# Patient Record
Sex: Male | Born: 1980 | Hispanic: No | Marital: Married | Smoking: Current every day smoker
Health system: Southern US, Community
[De-identification: ages and names within clinical notes are randomized; demographics above are authoritative.]

---

## 2017-02-19 ENCOUNTER — Encounter: Payer: Self-pay | Admitting: Gynecology

## 2017-02-19 ENCOUNTER — Ambulatory Visit
Admission: EM | Admit: 2017-02-19 | Discharge: 2017-02-19 | Disposition: A | Discharge status: Home / Self Care | Payer: Self-pay | Attending: Family Medicine | Admitting: Family Medicine

## 2017-02-19 ENCOUNTER — Other Ambulatory Visit: Payer: Self-pay

## 2017-02-19 DIAGNOSIS — L03114 Cellulitis of left upper limb: Secondary | ICD-10-CM

## 2017-02-19 DIAGNOSIS — L0291 Cutaneous abscess, unspecified: Secondary | ICD-10-CM

## 2017-02-19 NOTE — Discharge Instructions (Signed)
Go straight to the ER. ° °Take care ° °Dr. Nowell Sites  °

## 2017-02-19 NOTE — ED Provider Notes (Signed)
[facility]    CSN: [id number] Arrival date & time: 02/19/17  1131  History   Chief Complaint Chief Complaint  Patient presents with  . Insect Bite  . Arm Swelling   HPI  36 year old male presents with left arm redness, swelling.  Patient states that he was working out of town this week.  He states that he did not stay in the best of hotels.  He is concerned that he had a bug bite.  He states that it started around his elbow.  It was a small area.  It is now tremendously worsened.  He has swelling and erythema which extends both proximally and distally.  Pain is moderate to severe. It it is essentially throughout his entire left arm.  His temperature is mildly elevated today.  He felt febrile at home.  No chills.  No medications or interventions tried.  He is concerned about his symptoms.  He has no other symptoms.  No other complaints at this time.  PMH - None.  Surgical Hx - None.  Home Medications    Prior to Admission medications   Not on File   Family History No reported family hx.  Social History Social History   Tobacco Use  . Smoking status: Current Every Day Smoker    Packs/day: 1.00    Types: Cigarettes  . Smokeless tobacco: Never Used  Substance Use Topics  . Alcohol use: Yes  . Drug use: No   Allergies   Patient has no known allergies.  Review of Systems Review of Systems  Skin:       Swelling, erythema, pain.  All other systems reviewed and are negative.  Physical Exam Triage Vital Signs ED Triage Vitals  Enc Vitals Group     BP 02/19/17 1150 121/63     Pulse Rate 02/19/17 1150 89     Resp 02/19/17 1150 16     Temp 02/19/17 1150 100.1 F (37.8 C)     Temp Source 02/19/17 1150 Oral     SpO2 02/19/17 1150 98 %     Weight 02/19/17 1150 170 lb (77.1 kg)     Height 02/19/17 1150 5\' 5"  (1.651 m)     Head Circumference --      Peak Flow --      Pain Score 02/19/17 1152 8     Pain Loc --      Pain Edu? --      Excl. in [place]? --     No data found.  Updated Vital Signs BP 121/63 (BP Location: Right Arm)   Pulse 89   Temp 100.1 F (37.8 C) (Oral)   Resp 16   Ht 5\' 5"  (1.651 m)   Wt 170 lb (77.1 kg)   SpO2 98%   BMI 28.29 kg/m   Visual Acuity Right Eye Distance:   Left Eye Distance:   Bilateral Distance:    Right Eye Near:   Left Eye Near:    Bilateral Near:     Physical Exam  Constitutional: He is oriented to person, place, and time. He appears well-developed. No distress.  Cardiovascular: Normal rate and regular rhythm.  No murmur heard. Pulmonary/Chest: Effort normal and breath sounds normal. He has no wheezes. He has no rales.  Musculoskeletal:  Left arm -unable to fully extend elbow secondary to swelling.  Neurological: He is alert and oriented to person, place, and time.  Skin:  Left arm -patient has a large area of induration around the left  medial elbow.  Erythema and swelling extends out both proximally and distally.  See the picture.  Psychiatric: He has a normal mood and affect. His behavior is normal.  Vitals reviewed.    UC Treatments / Results  Labs (all labs ordered are listed, but only abnormal results are displayed) Labs Reviewed - No data to display  EKG  EKG Interpretation None       Radiology No results found.  Procedures Procedures (including critical care time)  Medications Ordered in UC Medications - No data to display   Initial Impression / Assessment and Plan / UC Course  I have reviewed the triage vital signs and the nursing notes.  Pertinent labs & imaging results that were available during my care of the patient were reviewed by me and considered in my medical decision making (see chart for details).     36 year old male presents with diffuse cellulitis.  He also has an area concerning for abscess.  Given the extent of the infection, I am sending him directly to the ER.  Patient needs labs, IV antibiotics and likely surgery consult.  Final Clinical  Impressions(s) / UC Diagnoses   Final diagnoses:  Abscess  Cellulitis of left upper extremity    ED Discharge Orders    None     Controlled Substance Prescriptions [place] Controlled Substance Registry consulted? Not Applicable   Tommie SamsCook, Tirth Cothron G, DO 02/19/17 1233

## 2017-02-19 NOTE — ED Triage Notes (Signed)
Per patient x 3 days ago notice a lump on left arm. Pt. Question insect bite. Per patient now with left arm swelling and redness and warm to the touch.

## 2018-09-25 ENCOUNTER — Other Ambulatory Visit: Payer: Self-pay

## 2018-09-25 ENCOUNTER — Ambulatory Visit: Admission: EM | Admit: 2018-09-25 | Discharge: 2018-09-25 | Discharge status: Against Medical Advice | Payer: Self-pay

## 2018-09-25 ENCOUNTER — Encounter: Payer: Self-pay | Admitting: Emergency Medicine

## 2018-09-25 ENCOUNTER — Emergency Department
Admission: EM | Admit: 2018-09-25 | Discharge: 2018-09-25 | Disposition: A | Discharge status: Home / Self Care | Payer: Self-pay | Attending: Emergency Medicine | Admitting: Emergency Medicine

## 2018-09-25 ENCOUNTER — Emergency Department: Payer: Self-pay

## 2018-09-25 DIAGNOSIS — F17210 Nicotine dependence, cigarettes, uncomplicated: Secondary | ICD-10-CM | POA: Insufficient documentation

## 2018-09-25 DIAGNOSIS — R0989 Other specified symptoms and signs involving the circulatory and respiratory systems: Secondary | ICD-10-CM | POA: Insufficient documentation

## 2018-09-25 MED ORDER — DIPHENHYDRAMINE HCL 12.5 MG/5ML PO ELIX
12.5000 mg | ORAL_SOLUTION | Freq: Once | ORAL | Status: AC
  Administered 2018-09-25: 12.5 mg via ORAL
  Filled 2018-09-25: qty 5

## 2018-09-25 MED ORDER — LIDOCAINE VISCOUS HCL 2 % MT SOLN
5.0000 mL | Freq: Once | OROMUCOSAL | Status: AC
  Administered 2018-09-25: 5 mL via OROMUCOSAL
  Filled 2018-09-25: qty 15

## 2018-09-25 MED ORDER — LIDOCAINE VISCOUS HCL 2 % MT SOLN: 5.0000 mL | Freq: Four times a day (QID) | OROMUCOSAL | 0 refills | Status: AC | PRN

## 2018-09-25 NOTE — ED Notes (Signed)
See triage note  Presents with possible fish bone stuck in throat  No diff swallowing at present

## 2018-09-25 NOTE — ED Triage Notes (Signed)
Pt sates has a fishbone stuck in his throat. Pt reports can feel it but he can't get it out.

## 2018-09-25 NOTE — Discharge Instructions (Signed)
If no improvement in 2 to 3 days she may follow-up with the ENT doctor for reevaluation.  Call if needed to make an appointment tell you a follow-up in the emergency room.

## 2018-09-25 NOTE — ED Provider Notes (Signed)
[facility] Emergency Department Provider Note   ____________________________________________   First MD Initiated Contact with Patient 09/25/18 1319     (approximate)  I have reviewed the triage vital signs and the nursing notes.   HISTORY  Chief Complaint Swallowed Foreign Body    HPI William Santos is a 38 y.o. male patient state he swallowed a fishbone got caught in his throat last night around 8 PM.  Patient states still feels a fishbone but he cannot get it out.  Patient states discomfort with solid but can tolerate fluids.  Patient rates the pain as a 5/10.  Patient described the pain is "achy".  No palliative measure for complaint.         History reviewed. No pertinent past medical history.  There are no active problems to display for this patient.   History reviewed. No pertinent surgical history.  Prior to Admission medications   Medication Sig Start Date End Date Taking? Authorizing Provider  lidocaine (XYLOCAINE) 2 % solution Use as directed 5 mLs in the mouth or throat every 6 (six) hours as needed for mouth pain. Swish and slow swallow. 09/25/18   Sable Feil, PA-C    Allergies Patient has no known allergies.  No family history on file.  Social History Social History   Tobacco Use  . Smoking status: Current Every Day Smoker    Packs/day: 1.00    Types: Cigarettes  . Smokeless tobacco: Never Used  Substance Use Topics  . Alcohol use: Yes  . Drug use: No    Review of Systems Constitutional: No fever/chills Eyes: No visual changes. ENT: No sore throat.  Foreign body sensation. Cardiovascular: Denies chest pain. Respiratory: Denies shortness of breath. Gastrointestinal: No abdominal pain.  No nausea, no vomiting.  No diarrhea.  No constipation. Genitourinary: Negative for dysuria. Musculoskeletal: Negative for back pain. Skin: Negative for rash. Neurological: Negative for headaches, focal weakness or numbness.    ____________________________________________   PHYSICAL EXAM:  VITAL SIGNS: ED Triage Vitals  Enc Vitals Group     BP 09/25/18 1215 118/80     Pulse Rate 09/25/18 1215 66     Resp 09/25/18 1215 17     Temp 09/25/18 1215 98.5 F (36.9 C)     Temp Source 09/25/18 1215 Oral     SpO2 09/25/18 1215 98 %     Weight 09/25/18 1203 175 lb (79.4 kg)     Height 09/25/18 1203 5\' 6"  (1.676 m)     Head Circumference --      Peak Flow --      Pain Score 09/25/18 1202 5     Pain Loc --      Pain Edu? --      Excl. in [place]? --    Constitutional: Alert and oriented. Well appearing and in no acute distress. Mouth/Throat: Mucous membranes are moist.  Oropharynx non-erythematous. Neck: No stridor.   Hematological/Lymphatic/Immunilogical: No cervical lymphadenopathy. Cardiovascular: Normal rate, regular rhythm. Grossly normal heart sounds.  Good peripheral circulation. Respiratory: Normal respiratory effort.  No retractions. Lungs CTAB.   ____________________________________________   LABS (all labs ordered are listed, but only abnormal results are displayed)  Labs Reviewed - No data to display ____________________________________________  EKG   ____________________________________________  RADIOLOGY  ED MD interpretation:    Official radiology report(s): Dg Neck Soft Tissue  Result Date: 09/25/2018 CLINICAL DATA:  Patient reports a fishbone being stuck in his throat. EXAM: NECK SOFT TISSUES - 1+ VIEW COMPARISON:  None. FINDINGS: No foreign bodies are identified within the upper airway or proximal esophagus. The prevertebral soft tissues are normal. There is no thickening of the epiglottis. The bones appear unremarkable. IMPRESSION: No demonstrated foreign body in the neck or soft tissue swelling. Electronically Signed   By: Carey BullocksWilliam  Veazey M.D.   On: 09/25/2018 13:04    ____________________________________________   PROCEDURES  Procedure(s) performed (including Critical Care):   Procedures   ____________________________________________   INITIAL IMPRESSION / ASSESSMENT AND PLAN / ED COURSE  As part of my medical decision making, I reviewed the following data within the electronic MEDICAL RECORD NUMBER         William AldermanJuan Gear was evaluated in Emergency Department on 09/25/2018 for the symptoms described in the history of present illness. He was evaluated in the context of the global COVID-19 pandemic, which necessitated consideration that the patient might be at risk for infection with the SARS-CoV-2 virus that causes COVID-19. Institutional protocols and algorithms that pertain to the evaluation of patients at risk for COVID-19 are in a state of rapid change based on information released by regulatory bodies including the CDC and federal and state organizations. These policies and algorithms were followed during the patient's care in the ED.   Patient presents foreign body sensation secondary to swallowing a fishbone last night.  Discussed negative findings with soft tissue x-rays of the neck.  Patient given discharge care instructions advised follow-up with the ENT doctor if no improvement in 2 days.      ____________________________________________   FINAL CLINICAL IMPRESSION(S) / ED DIAGNOSES  Final diagnoses:  Foreign body sensation in throat     ED Discharge Orders         Ordered    lidocaine (XYLOCAINE) 2 % solution  Every 6 hours PRN     09/25/18 1330           Note:  This document was prepared using Dragon voice recognition software and may include unintentional dictation errors.    Joni ReiningSmith, Genie Wenke K, PA-C 09/25/18 1335    Minna AntisPaduchowski, Kevin, MD 09/25/18 1444

## 2020-05-23 IMAGING — CR NECK SOFT TISSUES - 1+ VIEW
2 series · 2 of 2 positions shown · non-contrast
Comparison: None.

CLINICAL DATA: Patient reports a fishbone being stuck in his
throat.

EXAM:
NECK SOFT TISSUES - 1+ VIEW

[neck lat]
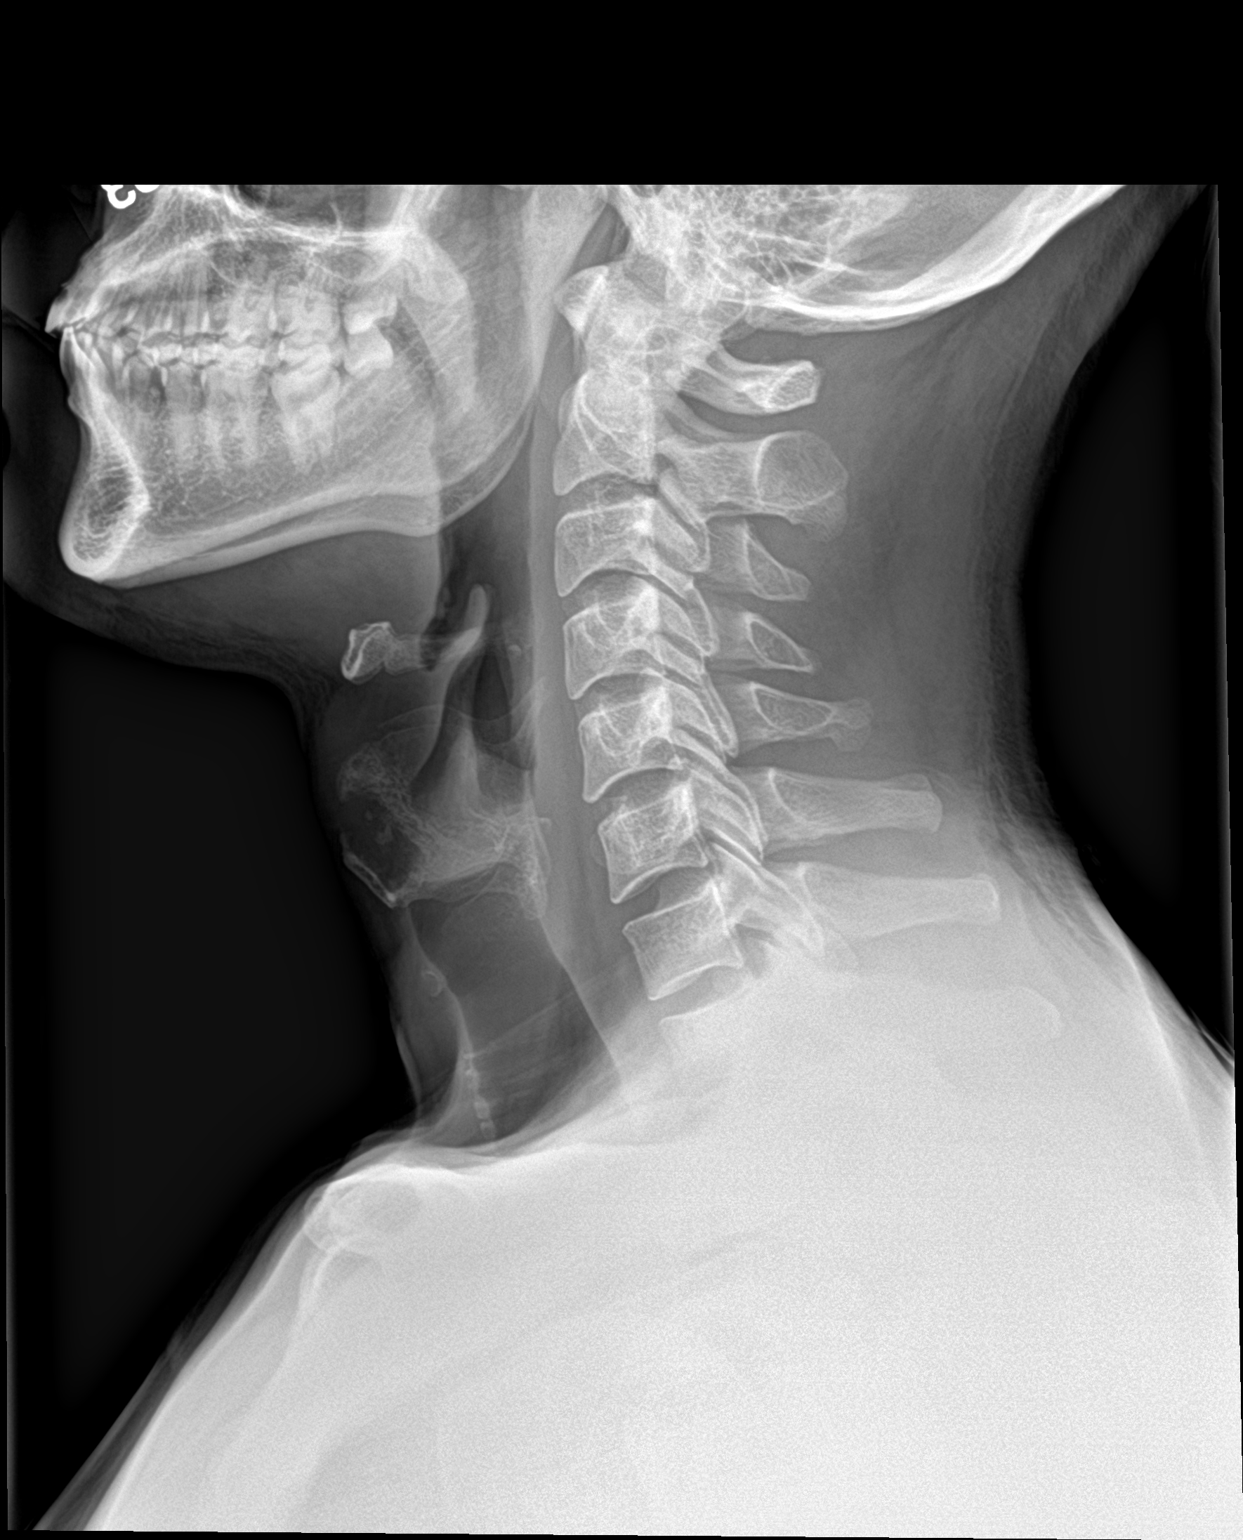

[neck ap]
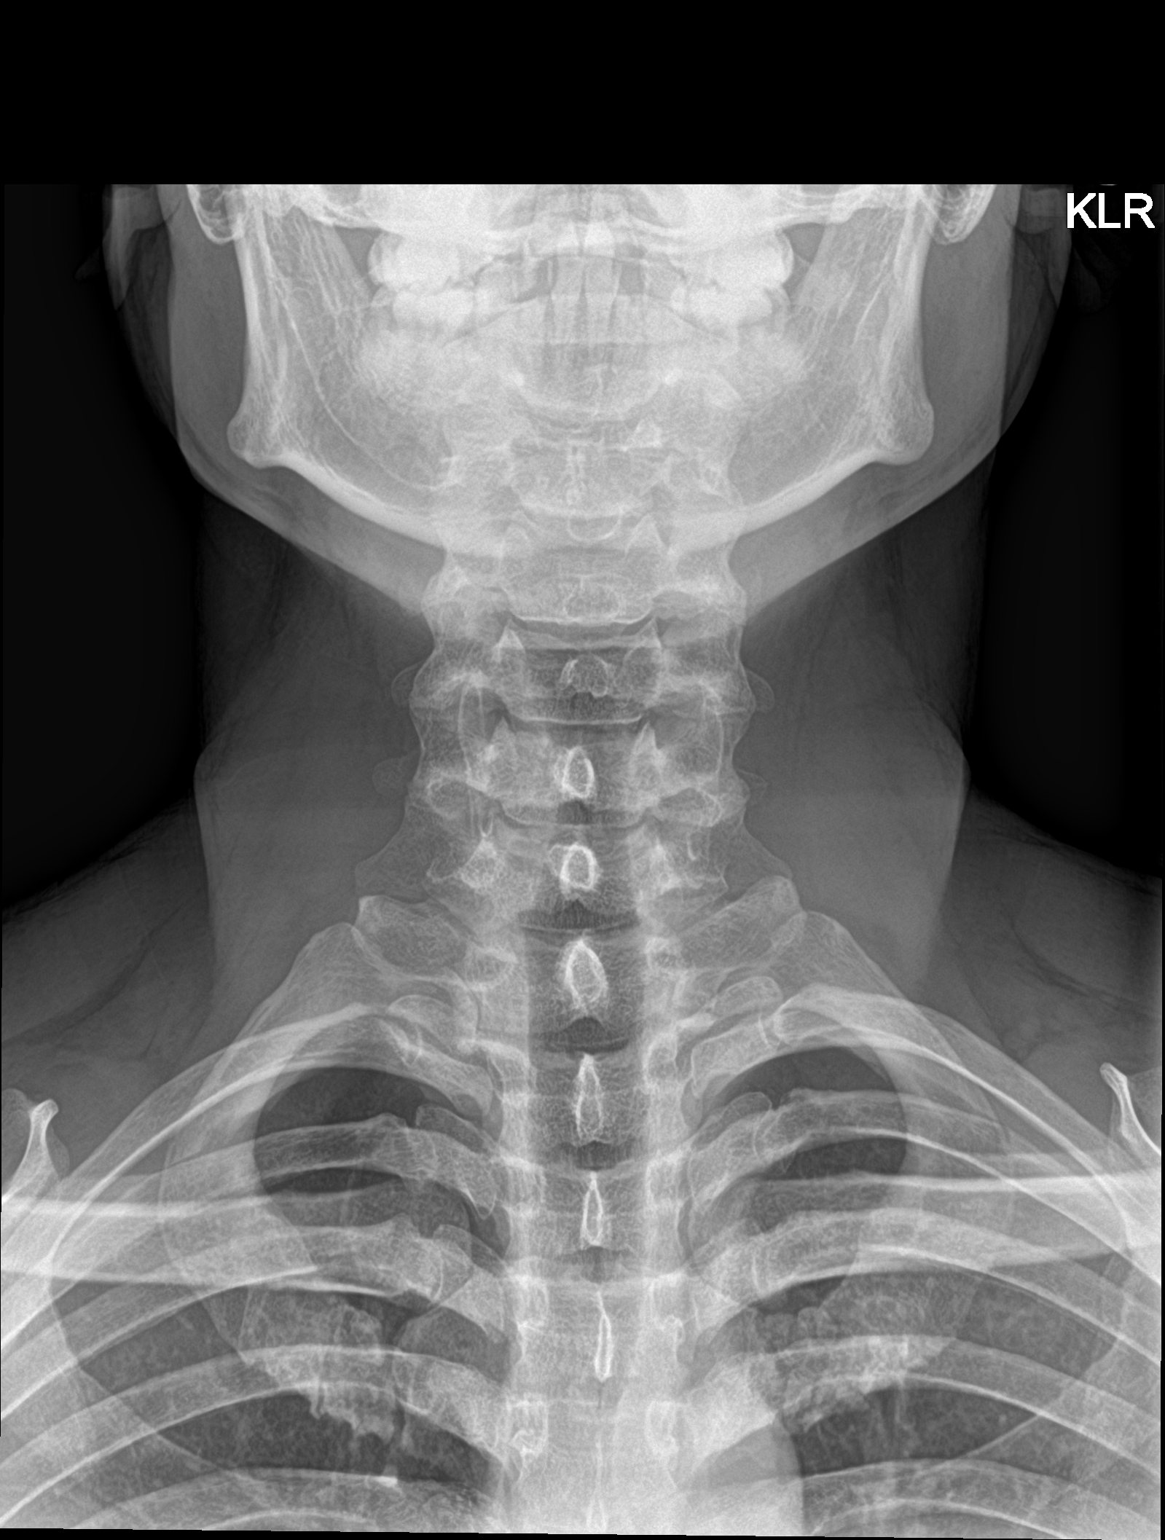

[2 of 2 positions shown; findings below may reference images not displayed]

FINDINGS: No foreign bodies are identified within the upper airway or proximal
esophagus. The prevertebral soft tissues are normal. There is no
thickening of the epiglottis. The bones appear unremarkable.
IMPRESSION: No demonstrated foreign body in the neck or soft tissue swelling.

## 2022-08-19 ENCOUNTER — Encounter: Payer: Self-pay | Admitting: Physician Assistant

## 2022-08-19 ENCOUNTER — Ambulatory Visit: Payer: Self-pay | Admitting: Family Medicine

## 2022-08-19 DIAGNOSIS — Z113 Encounter for screening for infections with a predominantly sexual mode of transmission: Secondary | ICD-10-CM

## 2022-08-19 LAB — HM HIV SCREENING LAB: HM HIV Screening: NEGATIVE

## 2022-08-19 NOTE — Progress Notes (Signed)
Here today for STD screening. Accepts bloodwork. Jessye Imhoff, RN ° °

## 2022-08-19 NOTE — Progress Notes (Signed)
[facility] Department STI clinic/screening visit  Subjective:  William Santos is a 42 y.o. male being seen today for an STI screening visit. The patient reports they do not have symptoms.    Patient has the following medical conditions:  There are no problems to display for this patient.    No chief complaint on file.   HPI  Patient reports to clinic for STI testing. Asymptomatic  Last HIV test per patient/review of record was No results found for: "HMHIVSCREEN" No results found for: "HIV"  Does the patient or their partner desires a pregnancy in the next year? No  Screening for MPX risk: Does the patient have an unexplained rash? No Is the patient MSM? No Does the patient endorse multiple sex partners or anonymous sex partners? No Did the patient have close or sexual contact with a person diagnosed with MPX? No Has the patient traveled outside the [place] where MPX is endemic? No Is there a high clinical suspicion for MPX-- evidenced by one of the following No  -Unlikely to be chickenpox  -Lymphadenopathy  -Rash that present in same phase of evolution on any given body part   See flowsheet for further details and programmatic requirements.    There is no immunization history on file for this patient.   The following portions of the patient's history were reviewed and updated as appropriate: allergies, current medications, past medical history, past social history, past surgical history and problem list.  Objective:  There were no vitals filed for this visit.  Physical Exam Vitals and nursing note reviewed.  Constitutional:      Appearance: Normal appearance.  HENT:     Head: Normocephalic and atraumatic.     Mouth/Throat:     Mouth: Mucous membranes are moist.     Pharynx: No oropharyngeal exudate or posterior oropharyngeal erythema.  Eyes:     General:        Right eye: No discharge.        Left eye: No discharge.     Conjunctiva/sclera:     Right eye:  Right conjunctiva is not injected. No exudate.    Left eye: Left conjunctiva is not injected. No exudate. Pulmonary:     Effort: Pulmonary effort is normal.  Abdominal:     General: Abdomen is flat.     Palpations: Abdomen is soft. There is no hepatomegaly or mass.     Tenderness: There is no abdominal tenderness. There is no rebound.  Genitourinary:    Comments: Declined genital exam- asymptomatic Lymphadenopathy:     Cervical: No cervical adenopathy.     Upper Body:     Right upper body: No supraclavicular or axillary adenopathy.     Left upper body: No supraclavicular or axillary adenopathy.  Skin:    General: Skin is warm and dry.  Neurological:     Mental Status: He is alert and oriented to person, place, and time.       Assessment and Plan:  William Santos is a 42 y.o. male presenting to the [facility] Department for STI screening  1. Screening for venereal disease  - HIV [facility] - Syphilis Serology, [facility] - Chlamydia/GC NAA, Confirmation   Patient does not have STI symptoms Patient accepted all screenings including  urine GC/Chlamydia, and blood work for HIV/Syphilis. Patient meets criteria for HepB screening? No. Ordered? not applicable Patient meets criteria for HepC screening? No. Ordered? not applicable Recommended condom use with all sex Discussed  importance of condom use for STI prevent  Treat positive test results per standing order. Discussed time line for [facility] results and that patient will be called with positive results and encouraged patient to call if he had not heard in 2 weeks Recommended repeat testing in 3 months with positive results. Recommended returning for continued or worsening symptoms.   Return if symptoms worsen or fail to improve, for STI screening.  No future appointments. Total time spent 20 minutes Lenice Llamas, [place]

## 2022-08-23 LAB — CHLAMYDIA/GC NAA, CONFIRMATION
Chlamydia trachomatis, NAA: NEGATIVE
Neisseria gonorrhoeae, NAA: NEGATIVE
